# Patient Record
Sex: Female | Born: 1965 | Race: Black or African American | Hispanic: No | Marital: Single | Smoking: Current every day smoker
Health system: Southern US, Community
[De-identification: ages and names within clinical notes are randomized; demographics above are authoritative.]

## PROBLEM LIST (undated history)

## (undated) DIAGNOSIS — I10 Essential (primary) hypertension: Secondary | ICD-10-CM

## (undated) DIAGNOSIS — E119 Type 2 diabetes mellitus without complications: Secondary | ICD-10-CM

---

## 2006-09-03 ENCOUNTER — Ambulatory Visit: Payer: Self-pay | Admitting: Neonatology

## 2006-09-03 ENCOUNTER — Inpatient Hospital Stay (HOSPITAL_COMMUNITY): Admission: AD | Admit: 2006-09-03 | Discharge: 2006-09-05 | Payer: Self-pay | Admitting: Obstetrics and Gynecology

## 2006-09-03 ENCOUNTER — Ambulatory Visit: Payer: Self-pay | Admitting: Gynecology

## 2006-09-04 ENCOUNTER — Encounter: Payer: Self-pay | Admitting: *Deleted

## 2021-05-18 ENCOUNTER — Emergency Department (HOSPITAL_BASED_OUTPATIENT_CLINIC_OR_DEPARTMENT_OTHER): Payer: Medicaid Other

## 2021-05-18 ENCOUNTER — Emergency Department (HOSPITAL_BASED_OUTPATIENT_CLINIC_OR_DEPARTMENT_OTHER)
Admission: EM | Admit: 2021-05-18 | Discharge: 2021-05-18 | Disposition: A | Discharge status: Home / Self Care | Payer: Medicaid Other | Attending: Emergency Medicine | Admitting: Emergency Medicine

## 2021-05-18 ENCOUNTER — Other Ambulatory Visit: Payer: Self-pay

## 2021-05-18 ENCOUNTER — Encounter (HOSPITAL_BASED_OUTPATIENT_CLINIC_OR_DEPARTMENT_OTHER): Payer: Self-pay | Admitting: *Deleted

## 2021-05-18 DIAGNOSIS — Z794 Long term (current) use of insulin: Secondary | ICD-10-CM | POA: Insufficient documentation

## 2021-05-18 DIAGNOSIS — F17210 Nicotine dependence, cigarettes, uncomplicated: Secondary | ICD-10-CM | POA: Diagnosis not present

## 2021-05-18 DIAGNOSIS — Z7984 Long term (current) use of oral hypoglycemic drugs: Secondary | ICD-10-CM | POA: Insufficient documentation

## 2021-05-18 DIAGNOSIS — E119 Type 2 diabetes mellitus without complications: Secondary | ICD-10-CM | POA: Diagnosis not present

## 2021-05-18 DIAGNOSIS — S199XXA Unspecified injury of neck, initial encounter: Secondary | ICD-10-CM | POA: Diagnosis present

## 2021-05-18 DIAGNOSIS — M79641 Pain in right hand: Secondary | ICD-10-CM | POA: Diagnosis not present

## 2021-05-18 DIAGNOSIS — M549 Dorsalgia, unspecified: Secondary | ICD-10-CM | POA: Insufficient documentation

## 2021-05-18 DIAGNOSIS — Y92410 Unspecified street and highway as the place of occurrence of the external cause: Secondary | ICD-10-CM | POA: Diagnosis not present

## 2021-05-18 DIAGNOSIS — Z7982 Long term (current) use of aspirin: Secondary | ICD-10-CM | POA: Insufficient documentation

## 2021-05-18 DIAGNOSIS — S161XXA Strain of muscle, fascia and tendon at neck level, initial encounter: Secondary | ICD-10-CM | POA: Insufficient documentation

## 2021-05-18 HISTORY — DX: Type 2 diabetes mellitus without complications: E11.9
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
## 2021-10-09 ENCOUNTER — Emergency Department (HOSPITAL_BASED_OUTPATIENT_CLINIC_OR_DEPARTMENT_OTHER): Payer: Medicaid Other

## 2021-10-09 ENCOUNTER — Other Ambulatory Visit: Payer: Self-pay

## 2021-10-09 ENCOUNTER — Encounter (HOSPITAL_BASED_OUTPATIENT_CLINIC_OR_DEPARTMENT_OTHER): Payer: Self-pay | Admitting: Emergency Medicine

## 2021-10-09 ENCOUNTER — Emergency Department (HOSPITAL_BASED_OUTPATIENT_CLINIC_OR_DEPARTMENT_OTHER)
Admission: EM | Admit: 2021-10-09 | Discharge: 2021-10-10 | Disposition: A | Discharge status: Home / Self Care | Payer: Medicaid Other | Attending: Emergency Medicine | Admitting: Emergency Medicine

## 2021-10-09 DIAGNOSIS — Z7982 Long term (current) use of aspirin: Secondary | ICD-10-CM | POA: Insufficient documentation

## 2021-10-09 DIAGNOSIS — M546 Pain in thoracic spine: Secondary | ICD-10-CM | POA: Diagnosis not present

## 2021-10-09 DIAGNOSIS — M79604 Pain in right leg: Secondary | ICD-10-CM | POA: Insufficient documentation

## 2021-10-09 DIAGNOSIS — Z79899 Other long term (current) drug therapy: Secondary | ICD-10-CM | POA: Insufficient documentation

## 2021-10-09 DIAGNOSIS — Z7984 Long term (current) use of oral hypoglycemic drugs: Secondary | ICD-10-CM | POA: Diagnosis not present

## 2021-10-09 DIAGNOSIS — E119 Type 2 diabetes mellitus without complications: Secondary | ICD-10-CM | POA: Insufficient documentation

## 2021-10-09 DIAGNOSIS — Z794 Long term (current) use of insulin: Secondary | ICD-10-CM | POA: Diagnosis not present

## 2021-10-09 DIAGNOSIS — R053 Chronic cough: Secondary | ICD-10-CM | POA: Insufficient documentation

## 2021-10-09 DIAGNOSIS — M79605 Pain in left leg: Secondary | ICD-10-CM | POA: Diagnosis not present

## 2021-10-09 DIAGNOSIS — I10 Essential (primary) hypertension: Secondary | ICD-10-CM | POA: Diagnosis not present

## 2021-10-09 HISTORY — DX: Essential (primary) hypertension: I10

## 2021-10-09 LAB — CBC WITH DIFFERENTIAL/PLATELET
Abs Immature Granulocytes: 0.02 10*3/uL (ref 0.00–0.07)
Basophils Absolute: 0 10*3/uL (ref 0.0–0.1)
Basophils Relative: 0 %
Eosinophils Absolute: 0.2 10*3/uL (ref 0.0–0.5)
Eosinophils Relative: 2 %
HCT: 39.3 % (ref 36.0–46.0)
Hemoglobin: 13.5 g/dL (ref 12.0–15.0)
Immature Granulocytes: 0 %
Lymphocytes Relative: 42 %
Lymphs Abs: 3.7 10*3/uL (ref 0.7–4.0)
MCH: 31.5 pg (ref 26.0–34.0)
MCHC: 34.4 g/dL (ref 30.0–36.0)
MCV: 91.6 fL (ref 80.0–100.0)
Monocytes Absolute: 0.5 10*3/uL (ref 0.1–1.0)
Monocytes Relative: 5 %
Neutro Abs: 4.6 10*3/uL (ref 1.7–7.7)
Neutrophils Relative %: 51 %
Platelets: 255 10*3/uL (ref 150–400)
RBC: 4.29 MIL/uL (ref 3.87–5.11)
RDW: 13.9 % (ref 11.5–15.5)
WBC: 9 10*3/uL (ref 4.0–10.5)
nRBC: 0 % (ref 0.0–0.2)

## 2021-10-09 NOTE — ED Provider Notes (Signed)
[facility] EMERGENCY DEPARTMENT Provider Note   CSN: [id number] Arrival date & time: 10/09/21  2055     History  Chief Complaint  Patient presents with   Back Pain    Connie Kline is a 56 y.o. female.  The history is provided by the patient and medical records.  Back Pain Connie Kline is a 56 y.o. female who presents to the Emergency Department complaining of back pain.  She presents to the emergency department for evaluation of 1 week of atraumatic left upper back pain.  She describes the pain as a pain.  She states that it is constant in nature but worse when she coughs as well as when she moves.  No associated shortness of breath.  She has a chronic cough, which is unchanged from baseline.  She also has chronic low back pain.  This is at its baseline.  She reports many years of bilateral lower extremity pain that radiates down from her back.  This is also unchanged from baseline.  No associated fevers, difficulty breathing, chest pain, nausea, vomiting, abdominal pain, dysuria, change in urine quality or quantity.  No numbness or weakness.  She has a history of hypertension, diabetes.  No recent injuries or accidents.    Home Medications Prior to Admission medications   Medication Sig Start Date End Date Taking? Authorizing Provider  albuterol (VENTOLIN HFA) 108 (90 Base) MCG/ACT inhaler USE 1-2 PUFFS EVERY 4 TO 6 HOURS AS NEEDED FOR SHORTNESS OF BREATH 07/31/16   [provider]  amLODipine (NORVASC) 5 MG tablet Take 1 tablet by mouth daily. 11/24/20   [provider]  aspirin 81 MG EC tablet Take 1 tablet by mouth daily. 06/07/17   [provider]  atenolol-chlorthalidone (TENORETIC) 100-25 MG tablet Take 1 tablet by mouth daily. 04/17/17   [provider]  atorvastatin (LIPITOR) 20 MG tablet Take 1 tablet by mouth daily. 04/20/21   [provider]  cetirizine (ZYRTEC) 10 MG tablet Take 1 tablet by mouth daily. 12/19/20   [provider]  cyclobenzaprine (FLEXERIL) 10 MG tablet TAKE 1 TABLET BY MOUTH EVERY DAY IN THE EVENING AS NEEDED FOR MUSCLE SPASMS 02/18/17   [provider]  Dulaglutide 0.75 MG/0.5ML SOPN Inject into the skin. 03/16/21   [provider]  empagliflozin (JARDIANCE) 10 MG TABS tablet Take 1 tablet by mouth daily. 11/24/20   [provider]  fluticasone (FLONASE) 50 MCG/ACT nasal spray Place into the nose. 12/19/20   [provider]  gabapentin (NEURONTIN) 300 MG capsule Take 1 capsule by mouth 3 (three) times daily. 09/07/16   [provider]  glipiZIDE (GLUCOTROL) 10 MG tablet TAKE 1 TABLET BY MOUTH EVERY DAY AT 10 AM. 03/20/17   [provider]  insulin detemir (LEVEMIR) 100 UNIT/ML FlexPen Inject into the skin. 11/25/20   [provider]  metFORMIN (GLUCOPHAGE-XR) 500 MG 24 hr tablet Take 2 tablets by mouth 2 (two) times daily. 09/19/16   [provider]  omeprazole (PRILOSEC) 40 MG capsule TAKE 1 CAPSULE (40 MG TOTAL) BY MOUTH NIGHTLY. 10/03/20   [provider]      Allergies    Aspirin, Losartan, Naproxen, and Tramadol    Review of Systems   Review of Systems  Musculoskeletal:  Positive for back pain.  All other systems reviewed and are negative.  Physical Exam Updated Vital Signs BP 130/82 (BP Location: Right Arm)    Pulse 81    Temp 98.5 F (36.9 C) (  Oral)    Resp 20    Ht 5\' 5"  (1.651 m)    Wt 103 kg    SpO2 99%    BMI 37.77 kg/m  Physical Exam Vitals and nursing note reviewed.  Constitutional:      Appearance: She is well-developed.  HENT:     Head: Normocephalic and atraumatic.  Cardiovascular:     Rate and Rhythm: Normal rate and regular rhythm.     Heart sounds: No murmur heard. Pulmonary:     Effort: Pulmonary effort is normal. No respiratory distress.     Breath sounds: Normal breath sounds.  Abdominal:     Palpations: Abdomen is soft.     Tenderness: There is no abdominal tenderness. There is no  guarding or rebound.  Musculoskeletal:        General: No tenderness.     Comments: 2+ radial and DP pulses bilaterally.  No lower extremity edema.  There is tenderness to palpation over the left scapular region as well as the midline lower back.  No rash.  Skin:    General: Skin is warm and dry.  Neurological:     Mental Status: She is alert and oriented to person, place, and time.     Comments: 5 out of 5 strength in all 4 extremities with sensation to light touch intact in all 4 extremities  Psychiatric:        Behavior: Behavior normal.    ED Results / Procedures / Treatments   Labs (all labs ordered are listed, but only abnormal results are displayed) Labs Reviewed  COMPREHENSIVE METABOLIC PANEL - Abnormal; Notable for the following components:      Result Value   Glucose, Bld 140 (*)    BUN 23 (*)    All other components within normal limits  CBC WITH DIFFERENTIAL/PLATELET  TROPONIN I (HIGH SENSITIVITY)    EKG None  Radiology DG Chest 2 View  Result Date: 10/09/2021 CLINICAL DATA:  Lower left-sided back pain x1 week. EXAM: CHEST - 2 VIEW COMPARISON:  Jan 11, 2019 FINDINGS: The heart size and mediastinal contours are within normal limits. Both lungs are clear. The visualized skeletal structures are unremarkable. IMPRESSION: No active cardiopulmonary disease. Electronically Signed   By: Jan 13, 2019 M.D.   On: 10/09/2021 23:35   DG Thoracic Spine 2 View  Result Date: 10/09/2021 CLINICAL DATA:  Upper back pain for several days, initial encounter EXAM: THORACIC SPINE 2 VIEWS COMPARISON:  05/18/2021 FINDINGS: There is no evidence of thoracic spine fracture. Alignment is normal. No other significant bone abnormalities are identified. IMPRESSION: No acute abnormality noted. Electronically Signed   By: 05/20/2021 M.D.   On: 10/09/2021 23:31   DG Lumbar Spine Complete  Result Date: 10/09/2021 CLINICAL DATA:  Left-sided back pain, no known injury, initial encounter EXAM:  LUMBAR SPINE - COMPLETE 4+ VIEW COMPARISON:  08/31/2016 FINDINGS: Five lumbar type vertebral bodies are well visualized. Vertebral body height is well maintained. No pars defects are noted. No anterolisthesis is seen. Changes of prior tubal ligation are again noted. No acute soft tissue abnormality is noted. IMPRESSION: No acute abnormality noted. Electronically Signed   By: 10/29/2016 M.D.   On: 10/09/2021 23:32    Procedures Procedures    Medications Ordered in ED Medications  oxyCODONE-acetaminophen (PERCOCET/ROXICET) 5-325 MG per tablet 1 tablet (1 tablet Oral Given 10/10/21 0113)  diphenhydrAMINE (BENADRYL) capsule 25 mg (25 mg Oral Given 10/10/21 0113)    ED Course/ Medical Decision Making/ A&P  Medical Decision Making Amount and/or Complexity of Data Reviewed Labs: ordered. Radiology: ordered.  Risk Prescription drug management.   Patient with history of hypertension, diabetes here for evaluation of 1 week of atraumatic left upper back pain.  She does also report that she has chronic lower back pain with bilateral lower extremity sciatica type symptoms.  Her lower extremity and low back pain symptoms are unchanged from her baseline.  Given her risk factors of diabetes and hypertension an EKG was obtained, which is abnormal.  No prior EKGs are available for comparison.  Labs were obtained, with normal troponin, renal function essentially within normal limits.  Current clinical picture is not consistent with ACS, presentation is atypical and symptoms have been ongoing for 1 week with a normal troponin.  Presentation is not consistent with dissection, PE, pneumonia.  Discussed with patient home care for back pain with Tylenol/ibuprofen, available over-the-counter.  She is already on cyclobenzaprine, gabapentin.  Discussed over-the-counter analgesic creams such as Lidoderm or Voltaren, warm compresses, rest and activity as tolerated.  Also discussed PCP follow-up  and return precautions.        Final Clinical Impression(s) / ED Diagnoses Final diagnoses:  Acute left-sided thoracic back pain    Rx / DC Orders ED Discharge Orders     None         Tilden Fossa, MD 10/10/21 0221

## 2021-10-09 NOTE — ED Notes (Signed)
Pt ambulatory to radiology

## 2021-10-09 NOTE — ED Notes (Signed)
ED Provider at bedside. 

## 2021-10-09 NOTE — ED Triage Notes (Signed)
Pt is c/o back pain  Pt states the pain is on the left side down to her lower back and radiating down both legs  Pt states this pain has been ongoing for about a week or two

## 2021-10-10 LAB — COMPREHENSIVE METABOLIC PANEL
ALT: 16 U/L (ref 0–44)
AST: 18 U/L (ref 15–41)
Albumin: 4.6 g/dL (ref 3.5–5.0)
Alkaline Phosphatase: 74 U/L (ref 38–126)
Anion gap: 8 (ref 5–15)
BUN: 23 mg/dL — ABNORMAL HIGH (ref 6–20)
CO2: 27 mmol/L (ref 22–32)
Calcium: 9.7 mg/dL (ref 8.9–10.3)
Chloride: 101 mmol/L (ref 98–111)
Creatinine, Ser: 0.94 mg/dL (ref 0.44–1.00)
GFR, Estimated: >60 mL/min (ref >60)
Glucose, Bld: 140 mg/dL — ABNORMAL HIGH (ref 70–99)
Potassium: 3.6 mmol/L (ref 3.5–5.1)
Sodium: 136 mmol/L (ref 135–145)
Total Bilirubin: 0.5 mg/dL (ref 0.3–1.2)
Total Protein: 7.8 g/dL (ref 6.5–8.1)

## 2021-10-10 LAB — TROPONIN I (HIGH SENSITIVITY): Troponin I (High Sensitivity): 2 ng/L (ref <18)

## 2021-10-10 MED ORDER — DIPHENHYDRAMINE HCL 25 MG PO CAPS
25.0000 mg | ORAL_CAPSULE | Freq: Once | ORAL | Status: AC
  Administered 2021-10-10: 25 mg via ORAL
  Filled 2021-10-10: qty 1

## 2021-10-10 MED ORDER — OXYCODONE-ACETAMINOPHEN 5-325 MG PO TABS
1.0000 | ORAL_TABLET | Freq: Once | ORAL | Status: AC
  Administered 2021-10-10: 1 via ORAL
  Filled 2021-10-10: qty 1

## 2021-10-10 MED ORDER — MORPHINE SULFATE 15 MG PO TABS
7.5000 mg | ORAL_TABLET | Freq: Once | ORAL | Status: DC
  Filled 2021-10-10: qty 1

## 2022-05-16 ENCOUNTER — Emergency Department (HOSPITAL_BASED_OUTPATIENT_CLINIC_OR_DEPARTMENT_OTHER): Payer: Medicaid Other

## 2022-05-16 ENCOUNTER — Encounter (HOSPITAL_BASED_OUTPATIENT_CLINIC_OR_DEPARTMENT_OTHER): Payer: Self-pay | Admitting: Emergency Medicine

## 2022-05-16 ENCOUNTER — Emergency Department (HOSPITAL_BASED_OUTPATIENT_CLINIC_OR_DEPARTMENT_OTHER)
Admission: EM | Admit: 2022-05-16 | Discharge: 2022-05-17 | Disposition: A | Discharge status: Home / Self Care | Payer: Medicaid Other | Attending: Emergency Medicine | Admitting: Emergency Medicine

## 2022-05-16 ENCOUNTER — Other Ambulatory Visit: Payer: Self-pay

## 2022-05-16 DIAGNOSIS — Z7982 Long term (current) use of aspirin: Secondary | ICD-10-CM | POA: Insufficient documentation

## 2022-05-16 DIAGNOSIS — R519 Headache, unspecified: Secondary | ICD-10-CM | POA: Insufficient documentation

## 2022-05-16 DIAGNOSIS — R42 Dizziness and giddiness: Secondary | ICD-10-CM | POA: Insufficient documentation

## 2022-05-16 DIAGNOSIS — Z794 Long term (current) use of insulin: Secondary | ICD-10-CM | POA: Diagnosis not present

## 2022-05-16 DIAGNOSIS — H9319 Tinnitus, unspecified ear: Secondary | ICD-10-CM | POA: Diagnosis not present

## 2022-05-16 LAB — BASIC METABOLIC PANEL
Anion gap: 8 (ref 5–15)
BUN: 15 mg/dL (ref 6–20)
CO2: 27 mmol/L (ref 22–32)
Calcium: 9.8 mg/dL (ref 8.9–10.3)
Chloride: 102 mmol/L (ref 98–111)
Creatinine, Ser: 0.77 mg/dL (ref 0.44–1.00)
GFR, Estimated: >60 mL/min (ref >60)
Glucose, Bld: 99 mg/dL (ref 70–99)
Potassium: 3.7 mmol/L (ref 3.5–5.1)
Sodium: 137 mmol/L (ref 135–145)

## 2022-05-16 LAB — GLUCOSE, CAPILLARY: Glucose-Capillary: 100 mg/dL — ABNORMAL HIGH (ref 70–99)

## 2022-05-16 LAB — URINALYSIS, ROUTINE W REFLEX MICROSCOPIC
Bilirubin Urine: NEGATIVE
Glucose, UA: >=500 mg/dL — AB
Hgb urine dipstick: NEGATIVE
Ketones, ur: NEGATIVE mg/dL
Leukocytes,Ua: NEGATIVE
Nitrite: NEGATIVE
Protein, ur: NEGATIVE mg/dL
Specific Gravity, Urine: 1.01 (ref 1.005–1.030)
pH: 6.5 (ref 5.0–8.0)

## 2022-05-16 LAB — CBC
HCT: 38.9 % (ref 36.0–46.0)
Hemoglobin: 13.4 g/dL (ref 12.0–15.0)
MCH: 31.4 pg (ref 26.0–34.0)
MCHC: 34.4 g/dL (ref 30.0–36.0)
MCV: 91.1 fL (ref 80.0–100.0)
Platelets: 234 10*3/uL (ref 150–400)
RBC: 4.27 MIL/uL (ref 3.87–5.11)
RDW: 13.3 % (ref 11.5–15.5)
WBC: 6 10*3/uL (ref 4.0–10.5)
nRBC: 0 % (ref 0.0–0.2)

## 2022-05-16 LAB — PREGNANCY, URINE: Preg Test, Ur: NEGATIVE

## 2022-05-16 LAB — URINALYSIS, MICROSCOPIC (REFLEX)

## 2022-05-16 MED ORDER — MECLIZINE HCL 12.5 MG PO TABS: 12.5000 mg | ORAL_TABLET | Freq: Three times a day (TID) | ORAL | 0 refills | Status: AC | PRN

## 2022-05-16 MED ORDER — MAGNESIUM SULFATE 2 GM/50ML IV SOLN
2.0000 g | Freq: Once | INTRAVENOUS | Status: AC
  Administered 2022-05-16: 2 g via INTRAVENOUS
  Filled 2022-05-16: qty 50

## 2022-05-16 MED ORDER — IOHEXOL 300 MG/ML  SOLN: 75.0000 mL | Freq: Once | INTRAMUSCULAR | Status: DC | PRN

## 2022-05-16 MED ORDER — MECLIZINE HCL 25 MG PO TABS
25.0000 mg | ORAL_TABLET | Freq: Once | ORAL | Status: AC
  Administered 2022-05-17: 25 mg via ORAL
  Filled 2022-05-16: qty 1

## 2022-05-16 MED ORDER — DIPHENHYDRAMINE HCL 50 MG/ML IJ SOLN
12.5000 mg | Freq: Once | INTRAMUSCULAR | Status: AC
  Administered 2022-05-16: 12.5 mg via INTRAVENOUS
  Filled 2022-05-16: qty 1

## 2022-05-16 MED ORDER — METOCLOPRAMIDE HCL 5 MG/ML IJ SOLN
10.0000 mg | Freq: Once | INTRAMUSCULAR | Status: AC
  Administered 2022-05-16: 10 mg via INTRAVENOUS
  Filled 2022-05-16: qty 2

## 2022-05-16 NOTE — ED Triage Notes (Signed)
Patient arrived via POV c/o dizziness x 3 weeks. Patient states left sided headache for same. Patient is states neuropathy in feet. Patient states 9/10 pain. Patient is AO x 4, VS WDL, normal gait.

## 2022-05-17 MED ORDER — IOHEXOL 350 MG/ML SOLN
75.0000 mL | Freq: Once | INTRAVENOUS | Status: AC | PRN
  Administered 2022-05-17: 75 mL via INTRAVENOUS

## 2022-05-17 NOTE — ED Provider Notes (Signed)
[facility] EMERGENCY DEPARTMENT Provider Note   CSN: [id number] Arrival date & time: 05/16/22  2011     History  Chief Complaint  Patient presents with   Dizziness    Genell Thede is a 56 y.o. female.  The history is provided by the patient.  Dizziness Quality:  Room spinning Severity:  Severe Onset quality:  Gradual Duration:  3 weeks Timing:  Constant Progression:  Unchanged Chronicity:  New Context: head movement   Relieved by:  Nothing Worsened by:  Nothing Associated symptoms: headaches and tinnitus   Associated symptoms: no chest pain, no shortness of breath and no weakness   Risk factors: no anemia, no hx of vertigo and no Meniere's disease        Home Medications Prior to Admission medications   Medication Sig Start Date End Date Taking? Authorizing Provider  meclizine (ANTIVERT) 12.5 MG tablet Take 1 tablet (12.5 mg total) by mouth 3 (three) times daily as needed for dizziness. 05/16/22  Yes Emanuele Mcwhirter, MD  albuterol (VENTOLIN HFA) 108 (90 Base) MCG/ACT inhaler USE 1-2 PUFFS EVERY 4 TO 6 HOURS AS NEEDED FOR SHORTNESS OF BREATH 07/31/16   [provider]  amLODipine (NORVASC) 5 MG tablet Take 1 tablet by mouth daily. 11/24/20   [provider]  aspirin 81 MG EC tablet Take 1 tablet by mouth daily. 06/07/17   [provider]  atenolol-chlorthalidone (TENORETIC) 100-25 MG tablet Take 1 tablet by mouth daily. 04/17/17   [provider]  atorvastatin (LIPITOR) 20 MG tablet Take 1 tablet by mouth daily. 04/20/21   [provider]  cetirizine (ZYRTEC) 10 MG tablet Take 1 tablet by mouth daily. 12/19/20   [provider]  cyclobenzaprine (FLEXERIL) 10 MG tablet TAKE 1 TABLET BY MOUTH EVERY DAY IN THE EVENING AS NEEDED FOR MUSCLE SPASMS 02/18/17   [provider]  Dulaglutide 0.75 MG/0.5ML SOPN Inject into the skin. 03/16/21   [provider]  empagliflozin (JARDIANCE) 10 MG TABS tablet Take 1  tablet by mouth daily. 11/24/20   [provider]  fluticasone (FLONASE) 50 MCG/ACT nasal spray Place into the nose. 12/19/20   [provider]  gabapentin (NEURONTIN) 300 MG capsule Take 1 capsule by mouth 3 (three) times daily. 09/07/16   [provider]  glipiZIDE (GLUCOTROL) 10 MG tablet TAKE 1 TABLET BY MOUTH EVERY DAY AT 10 AM. 03/20/17   [provider]  insulin detemir (LEVEMIR) 100 UNIT/ML FlexPen Inject into the skin. 11/25/20   [provider]  metFORMIN (GLUCOPHAGE-XR) 500 MG 24 hr tablet Take 2 tablets by mouth 2 (two) times daily. 09/19/16   [provider]  omeprazole (PRILOSEC) 40 MG capsule TAKE 1 CAPSULE (40 MG TOTAL) BY MOUTH NIGHTLY. 10/03/20   [provider]      Allergies    Aspirin, Losartan, Naproxen, and Tramadol    Review of Systems   Review of Systems  Constitutional:  Negative for fever.  HENT:  Positive for ear pain and tinnitus.   Respiratory:  Negative for shortness of breath, wheezing and stridor.   Cardiovascular:  Negative for chest pain.  Gastrointestinal:  Negative for abdominal pain.  Neurological:  Positive for dizziness and headaches. Negative for facial asymmetry, weakness and light-headedness.  All other systems reviewed and are negative.   Physical Exam Updated Vital Signs BP (!) 130/119   Pulse 77   Temp 98.2 F (36.8 C) (Oral)   Resp 20   Ht 5\' 1"  (1.549 m)  Wt 100.2 kg   SpO2 96%   BMI 41.76 kg/m  Physical Exam Vitals and nursing note reviewed.  Constitutional:      General: She is not in acute distress.    Appearance: Normal appearance. She is well-developed.  HENT:     Head: Normocephalic and atraumatic.     Right Ear: Tympanic membrane normal.     Left Ear: Tympanic membrane normal.     Nose: Nose normal.     Mouth/Throat:     Mouth: Mucous membranes are moist.  Eyes:     Extraocular Movements: Extraocular movements intact.     Conjunctiva/sclera: Conjunctivae normal.      Pupils: Pupils are equal, round, and reactive to light.  Cardiovascular:     Rate and Rhythm: Normal rate and regular rhythm.     Pulses: Normal pulses.     Heart sounds: Normal heart sounds.  Pulmonary:     Effort: Pulmonary effort is normal. No respiratory distress.     Breath sounds: Normal breath sounds.  Abdominal:     General: Bowel sounds are normal. There is no distension.     Palpations: Abdomen is soft.     Tenderness: There is no abdominal tenderness. There is no guarding or rebound.  Genitourinary:    Vagina: No vaginal discharge.  Musculoskeletal:        General: Normal range of motion.     Cervical back: Neck supple.  Skin:    General: Skin is dry.     Capillary Refill: Capillary refill takes less than 2 seconds.     Findings: No erythema or rash.  Neurological:     General: No focal deficit present.     Mental Status: She is alert and oriented to person, place, and time.     Cranial Nerves: No cranial nerve deficit.     Sensory: No sensory deficit.     Deep Tendon Reflexes: Reflexes normal.     Comments: Sensory intact to all nerve distributions to direct confrontation   Psychiatric:        Mood and Affect: Mood normal.        Behavior: Behavior normal.     ED Results / Procedures / Treatments   Labs (all labs ordered are listed, but only abnormal results are displayed) Results for orders placed or performed during the hospital encounter of 05/16/22  Basic metabolic panel  Result Value Ref Range   Sodium 137 135 - 145 mmol/L   Potassium 3.7 3.5 - 5.1 mmol/L   Chloride 102 98 - 111 mmol/L   CO2 27 22 - 32 mmol/L   Glucose, Bld 99 70 - 99 mg/dL   BUN 15 6 - 20 mg/dL   Creatinine, Ser 0.08 0.44 - 1.00 mg/dL   Calcium 9.8 8.9 - 67.6 mg/dL   GFR, Estimated >19 >50 mL/min   Anion gap 8 5 - 15  CBC  Result Value Ref Range   WBC 6.0 4.0 - 10.5 K/uL   RBC 4.27 3.87 - 5.11 MIL/uL   Hemoglobin 13.4 12.0 - 15.0 g/dL   HCT 93.2 67.1 - 24.5 %   MCV 91.1  80.0 - 100.0 fL   MCH 31.4 26.0 - 34.0 pg   MCHC 34.4 30.0 - 36.0 g/dL   RDW 80.9 98.3 - 38.2 %   Platelets 234 150 - 400 K/uL   nRBC 0.0 0.0 - 0.2 %  Urinalysis, Routine w reflex microscopic Urine, Clean Catch  Result Value Ref Range  Color, Urine YELLOW YELLOW   APPearance CLEAR CLEAR   Specific Gravity, Urine 1.010 1.005 - 1.030   pH 6.5 5.0 - 8.0   Glucose, UA >=500 (A) NEGATIVE mg/dL   Hgb urine dipstick NEGATIVE NEGATIVE   Bilirubin Urine NEGATIVE NEGATIVE   Ketones, ur NEGATIVE NEGATIVE mg/dL   Protein, ur NEGATIVE NEGATIVE mg/dL   Nitrite NEGATIVE NEGATIVE   Leukocytes,Ua NEGATIVE NEGATIVE  Pregnancy, urine  Result Value Ref Range   Preg Test, Ur NEGATIVE NEGATIVE  Glucose, capillary  Result Value Ref Range   Glucose-Capillary 100 (H) 70 - 99 mg/dL   Comment 1 Notify RN   Urinalysis, Microscopic (reflex)  Result Value Ref Range   RBC / HPF 0-5 0 - 5 RBC/hpf   WBC, UA 0-5 0 - 5 WBC/hpf   Bacteria, UA RARE (A) NONE SEEN   Squamous Epithelial / LPF 0-5 0 - 5   CT ANGIO HEAD NECK W WO CM  Result Date: 05/17/2022 CLINICAL DATA:  Dizziness and left-sided headache EXAM: CT ANGIOGRAPHY HEAD AND NECK TECHNIQUE: Multidetector CT imaging of the head and neck was performed using the standard protocol during bolus administration of intravenous contrast. Multiplanar CT image reconstructions and MIPs were obtained to evaluate the vascular anatomy. Carotid stenosis measurements (when applicable) are obtained utilizing NASCET criteria, using the distal internal carotid diameter as the denominator. RADIATION DOSE REDUCTION: This exam was performed according to the departmental dose-optimization program which includes automated exposure control, adjustment of the mA and/or kV according to patient size and/or use of iterative reconstruction technique. CONTRAST:  43mL OMNIPAQUE IOHEXOL 350 MG/ML SOLN COMPARISON:  No prior CTA, correlation is made with CT head 05/16/2022 FINDINGS: CT HEAD  FINDINGS For noncontrast findings, please see same day CT head. CTA NECK FINDINGS Aortic arch: Standard branching. Imaged portion shows no evidence of aneurysm or dissection. No significant stenosis of the major arch vessel origins. Right carotid system: No evidence of dissection, occlusion, or hemodynamically significant stenosis (greater than 50%). Left carotid system: No evidence of dissection, occlusion, or hemodynamically significant stenosis (greater than 50%). Vertebral arteries: No evidence of dissection, occlusion, or hemodynamically significant stenosis (greater than 50%). Skeleton: No acute osseous abnormality. Other neck: Small hypoenhancing nodules in the thyroid (< 1.5 cm), for which no follow-up is indicated. (Reference: J Am Coll Radiol. 2015 Feb;12(2): 143-50) Upper chest: No focal pulmonary opacity or pleural effusion. Review of the MIP images confirms the above findings CTA HEAD FINDINGS Anterior circulation: Both internal carotid arteries are patent to the termini, with mild-to-moderate stenosis in the proximal right cavernous ICA A1 segments patent. Normal anterior communicating artery. Anterior cerebral arteries are patent to their distal aspects. No M1 stenosis or occlusion. MCA branches perfused and symmetric. Posterior circulation: Vertebral arteries patent to the vertebrobasilar junction without stenosis. Basilar patent to its distal aspect. Superior cerebellar arteries patent proximally. Patent P1 segments. PCAs perfused to their distal aspects without stenosis. The bilateral posterior communicating arteries are patent. Venous sinuses: As permitted by contrast timing, patent. Anatomic variants: None significant. Review of the MIP images confirms the above findings IMPRESSION: 1. No intracranial large vessel occlusion. 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 2. No hemodynamically significant stenosis in the neck. Electronically Signed   By: Wiliam Ke M.D.   On:  05/17/2022 00:31   CT HEAD WO CONTRAST  Result Date: 05/16/2022 CLINICAL DATA:  Dizziness and left-sided headache for 3 weeks. EXAM: CT HEAD WITHOUT CONTRAST TECHNIQUE: Contiguous axial images were obtained from the  base of the skull through the vertex without intravenous contrast. RADIATION DOSE REDUCTION: This exam was performed according to the departmental dose-optimization program which includes automated exposure control, adjustment of the mA and/or kV according to patient size and/or use of iterative reconstruction technique. COMPARISON:  01/11/2019. FINDINGS: Brain: No evidence of acute infarction, hemorrhage, hydrocephalus, extra-axial collection or mass lesion/mass effect. Vascular: No hyperdense vessel or unexpected calcification. Skull: Normal. Negative for fracture or focal lesion. Sinuses/Orbits: No acute finding. Other: There is bony deformity of the medial orbital wall on the right, unchanged from the prior exam and likely related to old trauma. IMPRESSION: No acute intracranial process. Electronically Signed   By: Thornell SartoriusLaura  Taylor M.D.   On: 05/16/2022 20:44      EKG EKG Interpretation  Date/Time:  Wednesday May 16 2022 20:24:30 EDT Ventricular Rate:  78 PR Interval:  160 QRS Duration: 80 QT Interval:  390 QTC Calculation: 444 R Axis:   46 Text Interpretation: Normal sinus rhythm ST elevation consider anterior injury or acute infarct No significant change since last tracing Abnormal ECG When compared with ECG of 09-Oct-2021 23:38, PREVIOUS ECG IS PRESENT Confirmed by Vanetta MuldersZackowski, Scott [phone]([id number]) on 05/16/2022 8:31:53 PM  Radiology CT HEAD WO CONTRAST  Result Date: 05/16/2022 CLINICAL DATA:  Dizziness and left-sided headache for 3 weeks. EXAM: CT HEAD WITHOUT CONTRAST TECHNIQUE: Contiguous axial images were obtained from the base of the skull through the vertex without intravenous contrast. RADIATION DOSE REDUCTION: This exam was performed according to the departmental  dose-optimization program which includes automated exposure control, adjustment of the mA and/or kV according to patient size and/or use of iterative reconstruction technique. COMPARISON:  01/11/2019. FINDINGS: Brain: No evidence of acute infarction, hemorrhage, hydrocephalus, extra-axial collection or mass lesion/mass effect. Vascular: No hyperdense vessel or unexpected calcification. Skull: Normal. Negative for fracture or focal lesion. Sinuses/Orbits: No acute finding. Other: There is bony deformity of the medial orbital wall on the right, unchanged from the prior exam and likely related to old trauma. IMPRESSION: No acute intracranial process. Electronically Signed   By: Thornell SartoriusLaura  Taylor M.D.   On: 05/16/2022 20:44    Procedures Procedures    Medications Ordered in ED Medications  magnesium sulfate IVPB 2 g 50 mL (2 g Intravenous New Bag/Given 05/16/22 2353)  meclizine (ANTIVERT) tablet 25 mg (has no administration in time range)  metoCLOPramide (REGLAN) injection 10 mg (10 mg Intravenous Given 05/16/22 2348)  diphenhydrAMINE (BENADRYL) injection 12.5 mg (12.5 mg Intravenous Given 05/16/22 2345)  iohexol (OMNIPAQUE) 350 MG/ML injection 75 mL (75 mLs Intravenous Contrast Given 05/17/22 0003)    ED Course/ Medical Decision Making/ A&P                           Medical Decision Making Patient with 3 weeks of vertigo with left ear pain and tinnitus   Amount and/or Complexity of Data Reviewed External Data Reviewed: notes.    Details: Previous notes reviewed  Labs: ordered.    Details: All labs reviewed by me: normal sodium 137 and normal potassium 3.7, normal creatinine .77.  Normal white count 6, and normal hemoglobin 13.4 normal platelet count.  Urine is not consistent with UTI Radiology: ordered and independent interpretation performed.    Details: Negative head CT by me  ECG/medicine tests: ordered and independent interpretation performed. Decision-making details documented in ED  Course.  Risk Prescription drug management. Risk Details: Patient with symptoms consistent with peripheral vertigo.  No vascular occlusions  on CT.  Will start meclizine and refer to ENT for ongoing care.  Patient is well appearing with 5/5 strength throughout and cranial nerves and sensation intact.  Patient is stable for discharge with close follow up.  Strict return.      Final Clinical Impression(s) / ED Diagnoses Final diagnoses:  Vertigo   Return for intractable cough, coughing up blood, fevers > 100.4 unrelieved by medication, shortness of breath, intractable vomiting, chest pain, shortness of breath, weakness, numbness, changes in speech, facial asymmetry, abdominal pain, passing out, Inability to tolerate liquids or food, cough, altered mental status or any concerns. No signs of systemic illness or infection. The patient is nontoxic-appearing on exam and vital signs are within normal limits.  I have reviewed the triage vital signs and the nursing notes. Pertinent labs & imaging results that were available during my care of the patient were reviewed by me and considered in my medical decision making (see chart for details). After history, exam, and medical workup I feel the patient has been appropriately medically screened and is safe for discharge home. Pertinent diagnoses were discussed with the patient. Patient was given return precautions.  Rx / DC Orders ED Discharge Orders          Ordered    meclizine (ANTIVERT) 12.5 MG tablet  3 times daily PRN        05/16/22 2359              Syrai Gladwin, MD 05/17/22 3007

## 2022-12-04 IMAGING — DX DG LUMBAR SPINE COMPLETE 4+V
5 series · 5 of 5 positions shown · non-contrast
Comparison: 08/31/2016

CLINICAL DATA: Left-sided back pain, no known injury, initial
encounter

EXAM:
LUMBAR SPINE - COMPLETE 4+ VIEW

[l-spine ap]
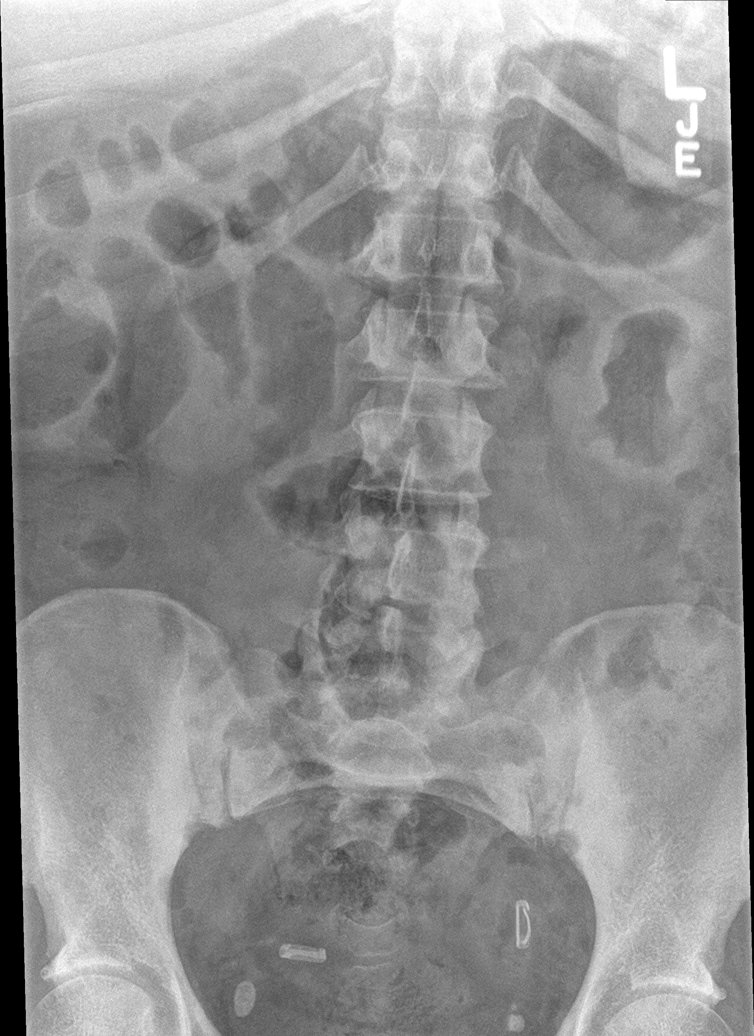

[l-spine obl (1 of 2)]
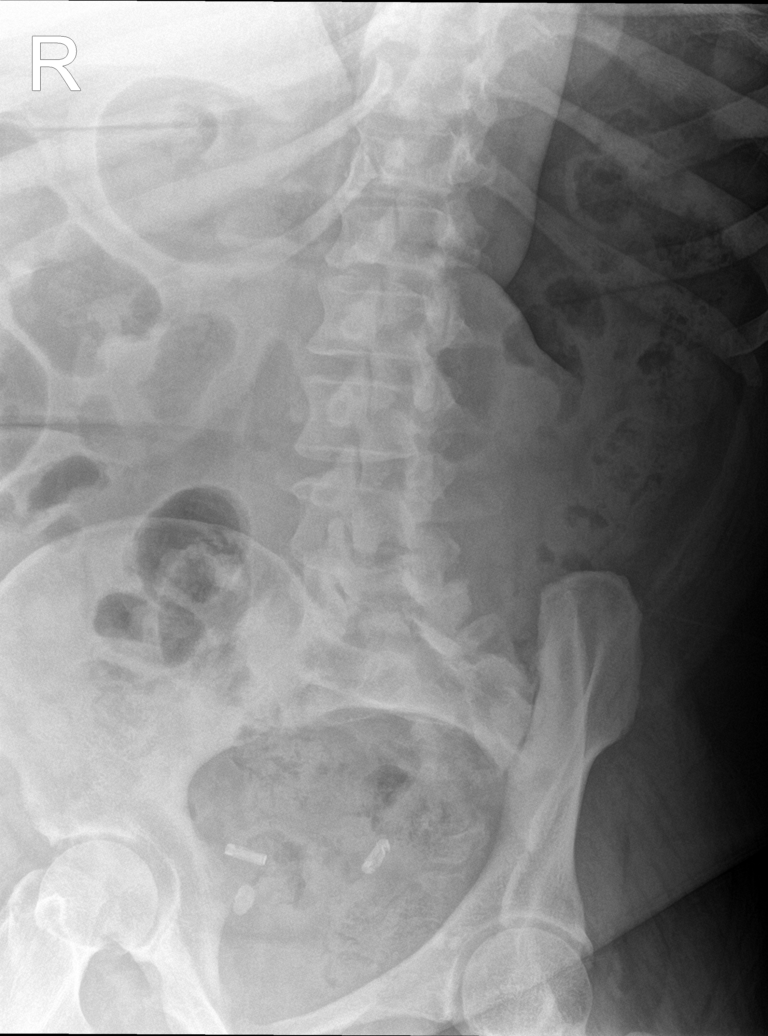

[l-spine obl (2 of 2)]
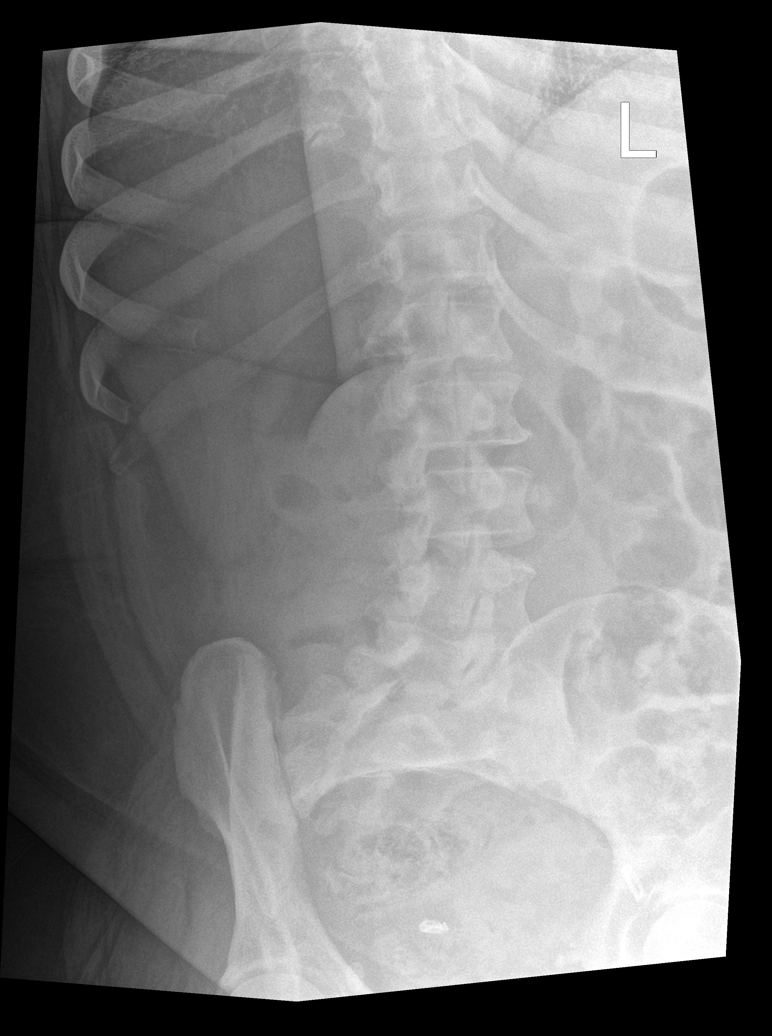

[l-spine lat]
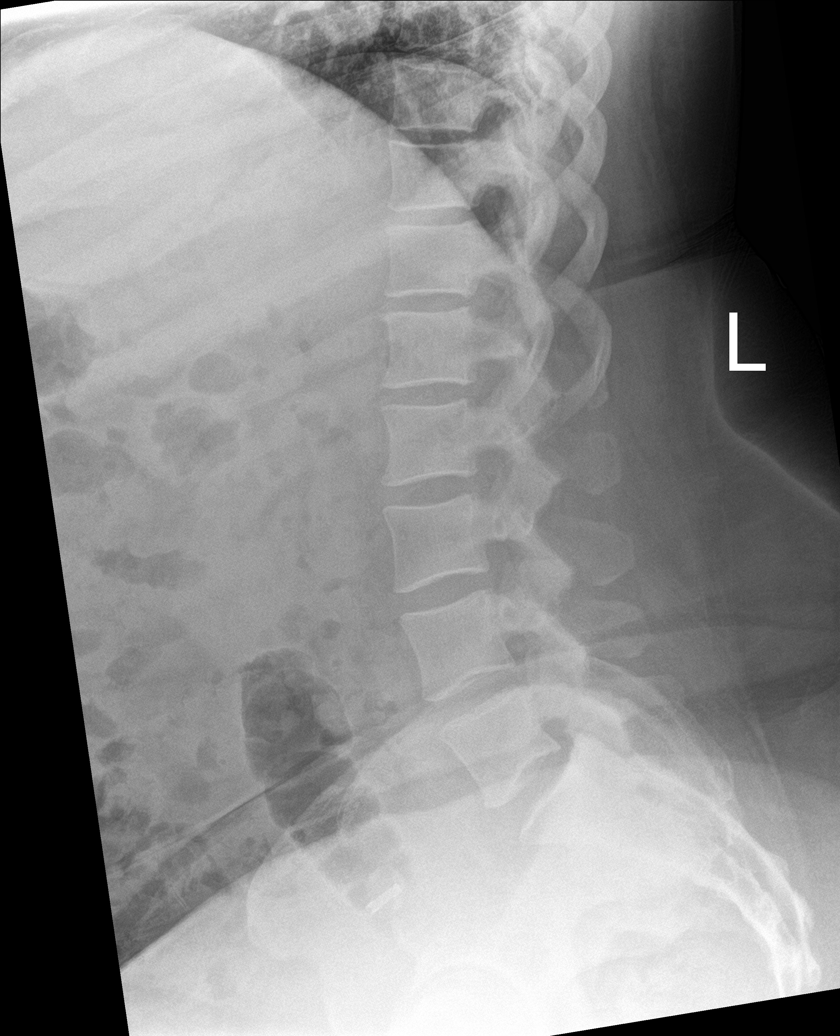

[l-spine spot]
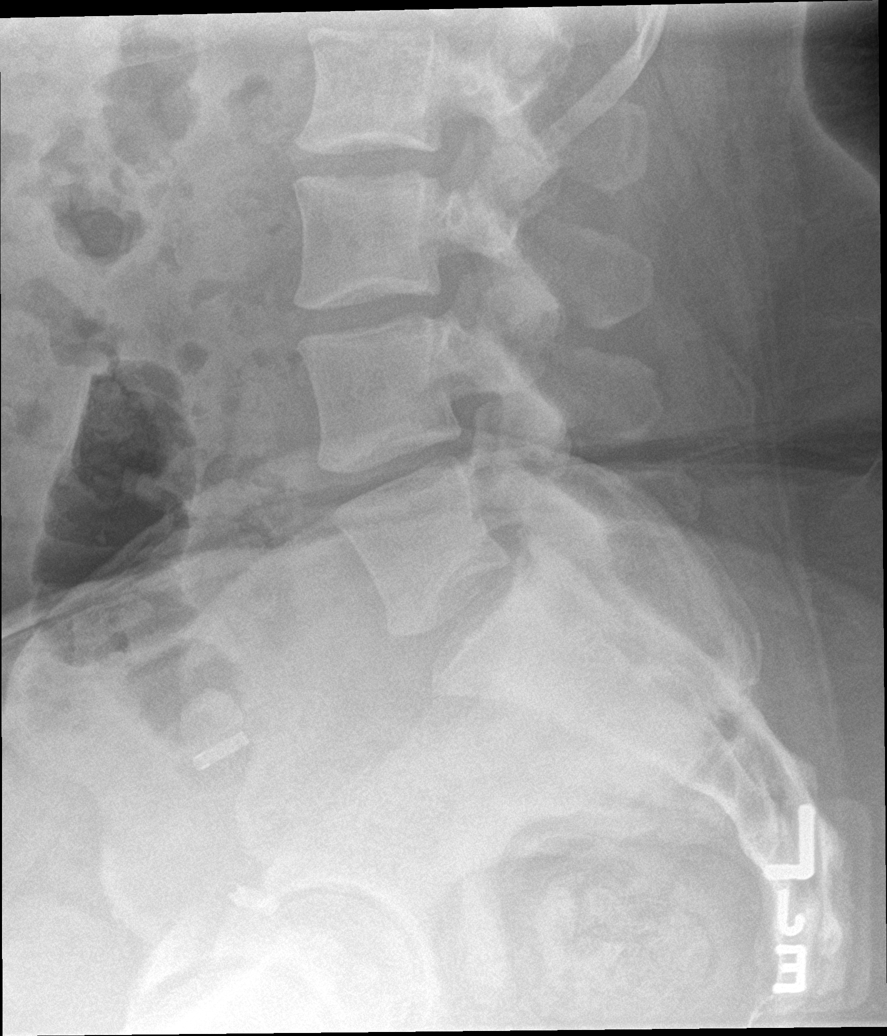

[5 of 5 positions shown; findings below may reference images not displayed]

FINDINGS: Five lumbar type vertebral bodies are well visualized. Vertebral
body height is well maintained. No pars defects are noted. No
anterolisthesis is seen. Changes of prior tubal ligation are again
noted. No acute soft tissue abnormality is noted.
IMPRESSION: No acute abnormality noted.

## 2023-01-06 ENCOUNTER — Emergency Department (HOSPITAL_BASED_OUTPATIENT_CLINIC_OR_DEPARTMENT_OTHER): Payer: Medicaid Other

## 2023-01-06 ENCOUNTER — Encounter (HOSPITAL_BASED_OUTPATIENT_CLINIC_OR_DEPARTMENT_OTHER): Payer: Self-pay | Admitting: Emergency Medicine

## 2023-01-06 ENCOUNTER — Emergency Department (HOSPITAL_BASED_OUTPATIENT_CLINIC_OR_DEPARTMENT_OTHER)
Admission: EM | Admit: 2023-01-06 | Discharge: 2023-01-06 | Disposition: A | Discharge status: Home / Self Care | Payer: Medicaid Other | Attending: Emergency Medicine | Admitting: Emergency Medicine

## 2023-01-06 ENCOUNTER — Other Ambulatory Visit: Payer: Self-pay

## 2023-01-06 DIAGNOSIS — W01198A Fall on same level from slipping, tripping and stumbling with subsequent striking against other object, initial encounter: Secondary | ICD-10-CM | POA: Insufficient documentation

## 2023-01-06 DIAGNOSIS — Z7982 Long term (current) use of aspirin: Secondary | ICD-10-CM | POA: Insufficient documentation

## 2023-01-06 DIAGNOSIS — Z794 Long term (current) use of insulin: Secondary | ICD-10-CM | POA: Diagnosis not present

## 2023-01-06 DIAGNOSIS — M25552 Pain in left hip: Secondary | ICD-10-CM

## 2023-01-06 DIAGNOSIS — M533 Sacrococcygeal disorders, not elsewhere classified: Secondary | ICD-10-CM | POA: Diagnosis not present

## 2023-01-06 NOTE — ED Triage Notes (Signed)
Pt c/o LT hip pain with radiation to LLE; c/o chronic pain all over and is doing PT; had a fall in April and that exacerbated hip pain

## 2023-01-06 NOTE — ED Provider Notes (Signed)
[place] [facility] AT [facility] Provider Note   CSN: [id number] Arrival date & time: 01/06/23  1906     History {Add pertinent medical, surgical, social history, OB history to HPI:1} Chief Complaint  Patient presents with   Hip Pain    Connie Kline is a 57 y.o. female.  2 weeks ago fell and hit her left hip when she tripped       Home Medications Prior to Admission medications   Medication Sig Start Date End Date Taking? Authorizing Provider  albuterol (VENTOLIN HFA) 108 (90 Base) MCG/ACT inhaler USE 1-2 PUFFS EVERY 4 TO 6 HOURS AS NEEDED FOR SHORTNESS OF BREATH 07/31/16   [provider]  amLODipine (NORVASC) 5 MG tablet Take 1 tablet by mouth daily. 11/24/20   [provider]  aspirin 81 MG EC tablet Take 1 tablet by mouth daily. 06/07/17   [provider]  atenolol-chlorthalidone (TENORETIC) 100-25 MG tablet Take 1 tablet by mouth daily. 04/17/17   [provider]  atorvastatin (LIPITOR) 20 MG tablet Take 1 tablet by mouth daily. 04/20/21   [provider]  cetirizine (ZYRTEC) 10 MG tablet Take 1 tablet by mouth daily. 12/19/20   [provider]  cyclobenzaprine (FLEXERIL) 10 MG tablet TAKE 1 TABLET BY MOUTH EVERY DAY IN THE EVENING AS NEEDED FOR MUSCLE SPASMS 02/18/17   [provider]  Dulaglutide 0.75 MG/0.5ML SOPN Inject into the skin. 03/16/21   [provider]  empagliflozin (JARDIANCE) 10 MG TABS tablet Take 1 tablet by mouth daily. 11/24/20   [provider]  fluticasone (FLONASE) 50 MCG/ACT nasal spray Place into the nose. 12/19/20   [provider]  gabapentin (NEURONTIN) 300 MG capsule Take 1 capsule by mouth 3 (three) times daily. 09/07/16   [provider]  glipiZIDE (GLUCOTROL) 10 MG tablet TAKE 1 TABLET BY MOUTH EVERY DAY AT 10 AM. 03/20/17   [provider]  insulin detemir (LEVEMIR) 100 UNIT/ML FlexPen Inject into the skin. 11/25/20   [provider]  meclizine (ANTIVERT) 12.5 MG tablet Take 1 tablet (12.5 mg total) by mouth 3 (three) times daily as needed for dizziness. 05/16/22   Palumbo, April, MD  metFORMIN (GLUCOPHAGE-XR) 500 MG 24 hr tablet Take 2 tablets by mouth 2 (two) times daily. 09/19/16   [provider]  omeprazole (PRILOSEC) 40 MG capsule TAKE 1 CAPSULE (40 MG TOTAL) BY MOUTH NIGHTLY. 10/03/20   [provider]      Allergies    Aspirin, Losartan, Naproxen, and Tramadol    Review of Systems   Review of Systems  Physical Exam Updated Vital Signs BP (!) 150/110 (BP Location: Right Arm)   Pulse 74   Temp 97.7 F (36.5 C)   Resp 18   Ht 5\' 4"  (1.626 m)   Wt 103 kg   SpO2 99%   BMI 38.96 kg/m  Physical Exam  ED Results / Procedures / Treatments   Labs (all labs ordered are listed, but only abnormal results are displayed) Labs Reviewed - No data to display  EKG None  Radiology DG Hip Unilat W or Wo Pelvis 2-3 Views Left  Result Date: 01/06/2023 CLINICAL DATA:  Left hip pain with radiation to the left lower extremity. Fall in April that exacerbated the pain. EXAM: DG HIP (WITH OR WITHOUT PELVIS) 2-3V LEFT; SACRUM AND COCCYX - 2+ VIEW COMPARISON:  MRI pelvis 09/05/2006 FINDINGS: There is no evidence of hip fracture or dislocation. Mild chronic widening of  the pubic symphysis similar to MRI 09/05/2006. Degenerative changes pubic symphysis, both hips, SI joints and lower lumbar spine. Tubal ligation clips. IMPRESSION: No acute fracture or dislocation. Electronically Signed   By: Minerva Fester M.D.   On: 01/06/2023 20:33   DG Sacrum/Coccyx  Result Date: 01/06/2023 CLINICAL DATA:  Left hip pain with radiation to the left lower extremity. Fall in April that exacerbated the pain. EXAM: DG HIP (WITH OR WITHOUT PELVIS) 2-3V LEFT; SACRUM AND COCCYX - 2+ VIEW COMPARISON:  MRI pelvis 09/05/2006 FINDINGS: There is no evidence of hip fracture or dislocation. Mild chronic widening of the pubic  symphysis similar to MRI 09/05/2006. Degenerative changes pubic symphysis, both hips, SI joints and lower lumbar spine. Tubal ligation clips. IMPRESSION: No acute fracture or dislocation. Electronically Signed   By: Minerva Fester M.D.   On: 01/06/2023 20:33    Procedures Procedures  {Document cardiac monitor, telemetry assessment procedure when appropriate:1}  Medications Ordered in ED Medications - No data to display  ED Course/ Medical Decision Making/ A&P   {   Click here for ABCD2, HEART and other calculatorsREFRESH Note before signing :1}                          Medical Decision Making Amount and/or Complexity of Data Reviewed Radiology: ordered.   ***  {Document critical care time when appropriate:1} {Document review of labs and clinical decision tools ie heart score, Chads2Vasc2 etc:1}  {Document your independent review of radiology images, and any outside records:1} {Document your discussion with family members, caretakers, and with consultants:1} {Document social determinants of health affecting pt's care:1} {Document your decision making why or why not admission, treatments were needed:1} Final Clinical Impression(s) / ED Diagnoses Final diagnoses:  None    Rx / DC Orders ED Discharge Orders     None

## 2023-01-06 NOTE — Discharge Instructions (Signed)
You were seen for your hip pain in the emergency department.  Your x-ray did not show any broken bones.  At home, please continue your pain medications.  Please also start taking Tylenol and use over-the-counter lidocaine patches for your pain.    Follow-up with your primary doctor in 2-3 days regarding your visit.  Follow-up with pain medicine soon as possible.  Return immediately to the emergency department if you experience any other concerning symptoms.    Thank you for visiting our Emergency Department. It was a pleasure taking care of you today.
